# Patient Record
Sex: Male | Born: 1995 | Race: White | Hispanic: No | Marital: Single | Smoking: Never smoker
Health system: Southern US, Community
[De-identification: ages and names within clinical notes are randomized; demographics above are authoritative.]

## PROBLEM LIST (undated history)

## (undated) HISTORY — PX: OTHER SURGICAL HISTORY: SHX169

## (undated) HISTORY — PX: TONSILLECTOMY: SUR1361

---

## 2015-03-22 ENCOUNTER — Encounter (HOSPITAL_BASED_OUTPATIENT_CLINIC_OR_DEPARTMENT_OTHER): Payer: Self-pay | Admitting: *Deleted

## 2015-03-22 ENCOUNTER — Emergency Department (HOSPITAL_BASED_OUTPATIENT_CLINIC_OR_DEPARTMENT_OTHER): Payer: No Typology Code available for payment source

## 2015-03-22 ENCOUNTER — Emergency Department (HOSPITAL_BASED_OUTPATIENT_CLINIC_OR_DEPARTMENT_OTHER)
Admission: EM | Admit: 2015-03-22 | Discharge: 2015-03-22 | Disposition: A | Discharge status: Home / Self Care | Payer: No Typology Code available for payment source | Attending: Emergency Medicine | Admitting: Emergency Medicine

## 2015-03-22 DIAGNOSIS — Y92410 Unspecified street and highway as the place of occurrence of the external cause: Secondary | ICD-10-CM | POA: Insufficient documentation

## 2015-03-22 DIAGNOSIS — Y9389 Activity, other specified: Secondary | ICD-10-CM | POA: Diagnosis not present

## 2015-03-22 DIAGNOSIS — Y998 Other external cause status: Secondary | ICD-10-CM | POA: Insufficient documentation

## 2015-03-22 DIAGNOSIS — S199XXA Unspecified injury of neck, initial encounter: Secondary | ICD-10-CM | POA: Diagnosis present

## 2015-03-22 DIAGNOSIS — S161XXA Strain of muscle, fascia and tendon at neck level, initial encounter: Secondary | ICD-10-CM | POA: Insufficient documentation

## 2015-03-22 MED ORDER — CYCLOBENZAPRINE HCL 10 MG PO TABS: 10.0000 mg | ORAL_TABLET | Freq: Three times a day (TID) | ORAL | Status: AC | PRN

## 2015-03-22 MED ORDER — IBUPROFEN 800 MG PO TABS
800.0000 mg | ORAL_TABLET | Freq: Three times a day (TID) | ORAL | Status: AC | PRN
Start: 2015-03-22 — End: ?
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
## 2015-03-22 NOTE — Discharge Instructions (Signed)
Return here as needed.  Follow-up with an urgent care or primary care doctor.  Your choice to not show any abnormalities.  Ice and heat on your neck.  You will be more sore tomorrow over the next 7-10 days
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
